# Patient Record
Sex: Male | Born: 1990 | Race: Black or African American | Hispanic: No | Marital: Single | Smoking: Never smoker
Health system: Southern US, Community
[De-identification: ages and names within clinical notes are randomized; demographics above are authoritative.]

---
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
## 2015-02-12 ENCOUNTER — Ambulatory Visit
Admit: 2015-02-12 | Discharge: 2015-02-12 | Payer: PRIVATE HEALTH INSURANCE | Attending: Pulmonary Disease | Primary: Family Medicine

## 2015-02-12 DIAGNOSIS — G4730 Sleep apnea, unspecified: Secondary | ICD-10-CM

## 2015-02-12 NOTE — Progress Notes (Signed)
Patient is here to be evaluated for sleep apnea. He denies having a sleep study done before. He is a Consulting civil engineerstudent at [employer]. He doesn't have a PCP.   Allergies, medications, and history were reviewed.

## 2015-02-12 NOTE — Progress Notes (Signed)
[facility]      Roseanne Renoutul Qais Jowers, M.D.  Pulmonary Critical Care & Sleep Medicine   [address], [address] [place] [id number]  Office [phone]: [phone]  Pager: [phone]        Sleep Office Initial Consultation    Name: Ricardo Ross     DOB: [birth year]     Date: 02/12/2015      Consult requested from Dr. Amil AmenAnand Kapur for evaluation of sleep disorders and possibility of narcolepsy and opinion regarding the same.  First and foremost, sleep symptoms do include excessive daytime sleepiness, history of snoring.  No history of any witnessed apnea or anything per se. Does feel tired and unrefreshed.  He is 23, however all his life he always felt very tired and sleepy all the time.  He cannot function normally and that is the reason he wanted further evaluation.    He usually goes to bed around 12:30, wakes up at 7:00. On the weekend goes to bed around 12:30, wakes up at 9:00.  Finds it more refreshing.  Usually takes 20-30 minutes to go to sleep.  Doe shave trouble staying asleep.  Wakes up twice at nighttime basically to go to the bathroom.  Does toss and turn.  Does have vivid dreams.  Does not have any trouble going back to sleep again, usually asleep within 5 minutes.     Other history related to sleep apnea:  No history of any heart attack, stroke, COPD, heart failure.  History of tonsillectomy as a kid.    Sleep hygiene questions include:  Does have regular sleep wake cycle, does watch TV before going to sleep, does look at a clock, does do regular exercise.  No alcohol. Drinks a lot of tea during the daytime.  Drinks soda during the daytime.  No history of any smoking from the patient.    Parasomnia history point of view:  Does not have any history of sleepwalking, but does have history of sleeptalking.  Occasionally he has a lot of sleep movements where he has injured himself.  Does have history of nightmares and does remember nightmares. Does have some occasional  cramping, strong urge to move legs, especially at nighttime.  When I ask him about the cataplexy symptoms, he denies typical cataplexy symptoms of that, however after an exertion he feels he has a strong urge to go to sleep and he feels weak and needs to go to sleep.  Nothing particularly attached to the emotion per se, but after exertion he needs to go to sleep as he has a low energy level.    History of hypnagogic and hypnopompic hallucination, especially hypnopompic hallucinations, and no history of any sleep paralysis per se.    Does feel sleepy and tired during the daytime, does take 2-3 hour nap during the daytime.  Does find naps refreshing.  No history of any driving accidents or drowsy driving.    Epworth sleepiness score is 13/24.    No history of any cough, wheezing, chest pain or shortness of breath.    Assessment:  1. Excess daytime sleepiness.  2. Differentials include possibility of sleep apnea with snoring and daytime fatigue.  3. Possibility of atypical cataplexy and narcolepsy.  4. Possibility of long sleeper.    Discussion:  First and foremost, again patient has snoring, some excess daytime sleepiness, which requires him to be evaluated for the sleep study.  Along with that he does have some symptoms of this  feeling of profound weakness and some strong urge to sleep after exertion, which makes me wonder whether patient has some component of atypical cataplexy.  In that regard I will order a PSG with MSLT.  If PSG with MSLT is inconclusive or non diagnostic, it will be difficult to just diagnose him with narcolepsy as he does not have typical cataplexy.  We will continue to evaluate him with the PSG and MSLT results.  If the PSG/MSLT is essentially normal, he might have a long sleeper syndrome, in that sense he requires longer hours to sleep than usual normal population.  Sometimes stimulant therapy can be considered for the same.     I will see the patient back in three months, sooner if anything changes.    Final Recommendations:  1. PSG with MSLT.  2. Follow up in three months, sooner if anything changes.    Amil Amen:     Anand Kapur, M.D.          Prior/old records reviewed and discussed with patient.  Labs, Images and available PFT and sleep study discussed with patient. Labs and images personally seen and available reports reviewed with patient.  All current medicines are reviewed and doses and prescription adjusted.  Plan of care discussed/reported to primary physician.  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 No pertinent past medical history.    Past Surgical History   Procedure Laterality Date   ??? Hx tonsillectomy         History     Social History   ??? Marital Status: SINGLE     Spouse Name: N/A   ??? Number of Children: N/A   ??? Years of Education: N/A     Social History Main Topics   ??? Smoking status: Never Smoker    ??? Smokeless tobacco: Not on file   ??? Alcohol Use: No   ??? Drug Use: Not on file   ??? Sexual Activity: Not on file     Other Topics Concern   ??? None     Social History Narrative   ??? None       Family History   Problem Relation Age of Onset   ??? No Known Problems Mother    ??? No Known Problems Father        Allergies   Allergen Reactions   ??? Fish Containing Products Anaphylaxis     Review of Systems:  HEENT: No epistaxis, no nasal drainage, no difficulty in swallowing, no redness in eyes  Respiratory: No cough sob or wheezing  Sleep: As above   Cardiovascular: no chest pain, no palpitations, no chronic leg edema, no syncope  Gastrointestinal: no abd pain, no vomiting, no diarrhea, no bleeding symptoms  Genitourinary: No urinary symptoms or hematuria  Integument/breast: No ulcers or rashes  Musculoskeletal:Neg  Neurological: No focal weakness, no seizures, no headaches  Behvioral/Psych: No anxiety, no depression  Constitutional: No fever, no chills, no weight loss, no night sweats     Objective:   BP 102/68 mmHg   Pulse 75   Temp(Src) 98.3 ??F (36.8 ??C)   Resp 16   Ht 6\' 7"  (2.007 m)  Wt 106.323 kg (234 lb 6.4 oz)   BMI 26.40 kg/m2   SpO2 99%     Physical Exam: Weight 234 ESS 13 Neck Cir 16.5,   General: comfortable, no acute distress  HEENT: pupils reactive, sclera anicteric, EOM intact, Malampati score 2,   Tongue: Macroglossia y Teeth indentation y  Chin: Micrognathia y  Neck: No adenopathy or thyroid swelling, no lymphadenopathy or JVD, supple  CVS: S1S2 no murmurs  RS: Mod AE bilaterally, no tactile fremitus or egophony, no accessory muscle use  Abd: soft, non tender, no hepatosplenomegaly  Neuro: non focal, awake, alert  Extrm: no leg edema, clubbing or cyanosis  Skin: no rash    Data review:     Chemistry No results for input(s): GLU, NA, K, CL, CO2, BUN, CREA, CA, MG, PHOS, AGAP, BUCR, TBIL, GPT, AP, TP, ALB, GLOB, AGRAT in the last 72 hours.     Lactic Acid No results found for: LAC  No results for input(s): LAC in the last 72 hours.     Liver Enzymes No results found for: TP, ALB, GLOB, AGRAT, SGOT, GPT, AP, TBIL  No results for input(s): TP, ALB, GLOB, AGRAT, SGOT, GPT, AP, TBIL in the last 72 hours.    Invalid input(s): DBIL     CBC w/Diff No results for input(s): WBC, RBC, HGB, HCT, PLT, GRANS, LYMPH, EOS, HGBEXT, HCTEXT, PLTEXT in the last 72 hours.     Cardiac Enzymes No results found for: CPK, CKMMB, CKMB, RCK3, CKMBT, CKNDX, CKND1, MYO, TROPT, TROIQ, TROI, TROPT, TNIPOC, BNP, BNPP     BNP No results found for: BNP, BNPP, XBNPT      Coagulation No results for input(s): PTP, INR, APTT in the last 72 hours.    Invalid input(s): INREXT      Thyroid  No results found for: T4, T3U, TSH, TSHEXT       ABG No results for input(s): PHI, PHI, PCO2I, PO2, PO2I, HCO3, HCO3I, FIO2, FIO2I in the last 72 hours.    Invalid input(s): POC2     Urinalysis No results found for: COLOR, APPRN, SPGRU, REFSG, PHU, PROTU, GLUCU, KETU, BILU, UROU, NITU, LEUKU, GLUKE, EPSU, BACTU, WBCU, RBCU, CASTS, UCRY     Micro  No results for input(s): SDES, CULT in the last 72 hours.  No results for input(s): CULT in the last 72 hours.     XR (Most Recent). CXR reviewed by me and compared with previous CXR No results found for this or any previous visit.     CT (Most Recent) No results found for this or any previous visit.     EKG No results found for this or any previous visit.     ECHO No results found for this or any previous visit.     PFT No flowsheet data found.              Ilean Skill, MD  Pulmonary Critical Care & Sleep Medicine   [address] [place] [address]  [phone]

## 2015-02-12 NOTE — Patient Instructions (Signed)
Sleep Apnea: Care Instructions  Your Care Instructions  Sleep apnea means that you frequently stop breathing for 10 seconds or longer during sleep. It can be mild to severe, based on the number of times an hour that you stop breathing or have slowed breathing.  Blocked or narrowed airways in your nose, mouth, or throat can cause sleep apnea. Your airway can become blocked when your throat muscles and tongue relax during sleep.  You can treat sleep apnea at home by making lifestyle changes. You also can use a CPAP breathing machine that keeps tissues in the throat from blocking your airway. Or your doctor may suggest that you use a breathing device while you sleep. It helps keep your airway open. This could be a device that you put in your mouth. Other examples include strips or disks that you use on your nose. In some cases, surgery may be needed to remove enlarged tissues in the throat.  Follow-up care is a key part of your treatment and safety. Be sure to make and go to all appointments, and call your doctor if you are having problems. It's also a good idea to know your test results and keep a list of the medicines you take.  How can you care for yourself at home?  ?? Lose weight, if needed. It may reduce the number of times you stop breathing or have slowed breathing.  ?? Sleep on your side. It may stop mild apnea. If you tend to roll onto your back, sew a pocket in the back of your pajama top. Put a tennis ball into the pocket, and stitch the pocket shut. This will help keep you from sleeping on your back.  ?? Avoid alcohol and medicines such as sleeping pills and sedatives before bed.  ?? Do not smoke. Smoking can make sleep apnea worse. If you need help quitting, talk to your doctor about stop-smoking programs and medicines. These can increase your chances of quitting for good.  ?? Prop up the head of your bed 4 to 6 inches by putting bricks under the legs of the bed.   ?? Treat breathing problems, such as a stuffy nose, caused by a cold or allergies.  ?? Try a continuous positive airway pressure (CPAP) breathing machine if your doctor recommends it. The machine keeps your airway open when you sleep.  ?? If CPAP does not work for you, ask your doctor if you can try other breathing machines. A bilevel positive airway pressure machine uses one type of air pressure for breathing in and another type for breathing out. Another device raises or lowers air pressure as needed while you breathe.  ?? Talk to your doctor if:  ?? Your nose feels dry or bleeds when you use one of these machines. You may need to increase moisture in the air. A humidifier may help.  ?? Your nose is runny or stuffy from using a breathing machine. Decongestants or a corticosteroid nasal spray may help.  ?? You are sleepy during the day and it gets in the way of the normal things you do. Do not drive when you are drowsy.  When should you call for help?  Watch closely for changes in your health, and be sure to contact your doctor if:  ?? You still have sleep apnea even though you have made lifestyle changes.  ?? You are thinking of trying a device such as CPAP.  ?? You are having problems using a CPAP or similar machine.     Where can you learn more?   Go to http://www.healthwise.net/[facility]  Enter J936 in the search box to learn more about "Sleep Apnea: Care Instructions."   ?? 2006-2016 Healthwise, Inc[employer]. Care instructions adapted under license by [employer] (which disclaims liability or warranty for this information). This care instruction is for use with your licensed healthcare professional. If you have questions about a medical condition or this instruction, always ask your healthcare professional. Healthwise, Incorporated disclaims any warranty or liability for your use of this information.  Content Version: 10.8.513193; Current as of: May 23, 2014

## 2023-04-17 ENCOUNTER — Emergency Department: Payer: PRIVATE HEALTH INSURANCE

## 2023-04-17 ENCOUNTER — Encounter: Payer: Self-pay | Admitting: Emergency Medicine

## 2023-04-17 ENCOUNTER — Other Ambulatory Visit: Payer: Self-pay

## 2023-04-17 ENCOUNTER — Emergency Department
Admission: EM | Admit: 2023-04-17 | Discharge: 2023-04-17 | Disposition: A | Discharge status: Home / Self Care | Payer: PRIVATE HEALTH INSURANCE | Attending: Emergency Medicine | Admitting: Emergency Medicine

## 2023-04-17 DIAGNOSIS — T148XXA Other injury of unspecified body region, initial encounter: Secondary | ICD-10-CM

## 2023-04-17 DIAGNOSIS — S62661B Nondisplaced fracture of distal phalanx of left index finger, initial encounter for open fracture: Secondary | ICD-10-CM | POA: Diagnosis not present

## 2023-04-17 DIAGNOSIS — Z23 Encounter for immunization: Secondary | ICD-10-CM | POA: Insufficient documentation

## 2023-04-17 DIAGNOSIS — Y990 Civilian activity done for income or pay: Secondary | ICD-10-CM | POA: Diagnosis not present

## 2023-04-17 DIAGNOSIS — W232XXA Caught, crushed, jammed or pinched between a moving and stationary object, initial encounter: Secondary | ICD-10-CM | POA: Diagnosis not present

## 2023-04-17 DIAGNOSIS — S6992XA Unspecified injury of left wrist, hand and finger(s), initial encounter: Secondary | ICD-10-CM | POA: Diagnosis present

## 2023-04-17 MED ORDER — TETANUS-DIPHTH-ACELL PERTUSSIS 5-2.5-18.5 LF-MCG/0.5 IM SUSY
0.5000 mL | PREFILLED_SYRINGE | Freq: Once | INTRAMUSCULAR | Status: AC
  Administered 2023-04-17: 0.5 mL via INTRAMUSCULAR
  Filled 2023-04-17: qty 0.5

## 2023-04-17 NOTE — ED Provider Notes (Signed)
[facility] Provider Note  Patient Contact: 9:21 PM (approximate)   History   Finger Injury (L Index)   HPI  Ronald Spence is a 32 y.o. male presents to the emergency department with a 1-1/2 cm laceration along left index finger.  Patient states that he slammed his finger in the door today while at work, causing injury.  No numbness or tingling but patient's tetanus status is out of date.  Laceration has been open at least 12 hours.      Physical Exam   Triage Vital Signs: ED Triage Vitals  Encounter Vitals Group     BP 04/17/23 2046 (!) 140/102     Systolic BP Percentile --      Diastolic BP Percentile --      Pulse Rate 04/17/23 2046 77     Resp 04/17/23 2046 16     Temp 04/17/23 2046 97.9 F (36.6 C)     Temp Source 04/17/23 2046 Oral     SpO2 04/17/23 2046 98 %     Weight 04/17/23 2047 223 lb (101.2 kg)     Height 04/17/23 2047 6\' 8"  (2.032 m)     Head Circumference --      Peak Flow --      Pain Score 04/17/23 2047 1     Pain Loc --      Pain Education --      Exclude from Growth Chart --     Most recent vital signs: Vitals:   04/17/23 2046  BP: (!) 140/102  Pulse: 77  Resp: 16  Temp: 97.9 F (36.6 C)  SpO2: 98%     General: Alert and in no acute distress. Eyes:  PERRL. EOMI. Head: No acute traumatic findings ENT:      Nose: No congestion/rhinnorhea.      Mouth/Throat: Mucous membranes are moist. Neck: No stridor. No cervical spine tenderness to palpation. Cardiovascular:  Good peripheral perfusion Respiratory: Normal respiratory effort without tachypnea or retractions. Lungs CTAB. Good air entry to the bases with no decreased or absent breath sounds. Gastrointestinal: Bowel sounds 4 quadrants. Soft and nontender to palpation. No guarding or rigidity. No palpable masses. No distention. No CVA tenderness. Musculoskeletal: Full range of motion to all extremities.  No flexor or extensor tendon deficits appreciated with  testing. Neurologic:  No gross focal neurologic deficits are appreciated.  Skin: Patient has 1-1/2 cm volar laceration of left index finger. Other:   ED Results / Procedures / Treatments   Labs (all labs ordered are listed, but only abnormal results are displayed) Labs Reviewed - No data to display      RADIOLOGY  I personally viewed and evaluated these images as part of my medical decision making, as well as reviewing the written report by the radiologist.  ED Provider Interpretation: Nondisplaced fracture of the distal phalanx of the left second digit.  PROCEDURES:  Critical Care performed: No  ..Laceration Repair  Date/Time: 04/17/2023 9:24 PM  Performed by: Orvil Feil, PA-C Authorized by: Orvil Feil, PA-C   Consent:    Consent obtained:  Verbal   Risks discussed:  Infection and pain Universal protocol:    Procedure explained and questions answered to patient or proxy's satisfaction: yes     Patient identity confirmed:  Verbally with patient Anesthesia:    Anesthesia method:  None Laceration details:    Location:  Finger   Finger location:  L index finger   Length (cm):  1.5  Depth (mm):  2 Pre-procedure details:    Preparation:  Patient was prepped and draped in usual sterile fashion Exploration:    Limited defect created (wound extended): no     Contaminated: yes   Treatment:    Area cleansed with:  Povidone-iodine   Amount of cleaning:  Standard   Irrigation solution:  Sterile saline   Debridement:  None Skin repair:    Repair method:  Tissue adhesive Approximation:    Approximation:  Close Repair type:    Repair type:  Simple Post-procedure details:    Dressing:  Non-adherent dressing and splint for protection    MEDICATIONS ORDERED IN ED: Medications - No data to display   IMPRESSION / MDM / ASSESSMENT AND PLAN / ED COURSE  I reviewed the triage vital signs and the nursing notes.                              Assessment and  plan:  Open fracture 32 year old male presents to the emergency department with a crush type injury of the left index finger.  On exam, patient was alert, active and nontoxic-appearing.  He had full range of motion at the left index finger and laceration did appear that had been open for several hours, at least 12 hours.  Wound was irrigated in the emergency department and Dermabond was applied.  X-ray indicated a nondisplaced distal phalanx fracture and patient's finger was splinted into extension and I recommended follow-up with hand specialist, Dr. Stephenie Acres.  Return precautions were given to return with new or worsening symptoms.      FINAL CLINICAL IMPRESSION(S) / ED DIAGNOSES   Final diagnoses:  Open fracture     Rx / DC Orders   ED Discharge Orders     None        Note:  This document was prepared using Dragon voice recognition software and may include unintentional dictation errors.   Gasper Lloyd 04/17/23 2129    Jene Every, MD 04/18/23 [phone]

## 2023-04-17 NOTE — ED Triage Notes (Signed)
Pt c/o left index finger injury. 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 Presents with lacerations. Bleeding controlled. Unknown last tetanus

## 2023-04-18 ENCOUNTER — Telehealth: Payer: Self-pay | Admitting: Emergency Medicine

## 2023-04-18 MED ORDER — CEPHALEXIN 500 MG PO CAPS: 500.0000 mg | ORAL_CAPSULE | Freq: Four times a day (QID) | ORAL | 0 refills | Status: AC

## 2023-04-18 NOTE — Telephone Encounter (Cosign Needed)
Patient needs antibiotic called in.
